# Patient Record
Sex: Female | Born: 2000 | Hispanic: Yes | Marital: Single | Smoking: Never smoker
Health system: Southern US, Community
[De-identification: ages and names within clinical notes are randomized; demographics above are authoritative.]

## PROBLEM LIST (undated history)

## (undated) ENCOUNTER — Ambulatory Visit (HOSPITAL_COMMUNITY): Payer: Self-pay

## (undated) DIAGNOSIS — G43909 Migraine, unspecified, not intractable, without status migrainosus: Secondary | ICD-10-CM

## (undated) DIAGNOSIS — J302 Other seasonal allergic rhinitis: Secondary | ICD-10-CM

## (undated) DIAGNOSIS — J45909 Unspecified asthma, uncomplicated: Secondary | ICD-10-CM

## (undated) HISTORY — DX: Unspecified asthma, uncomplicated: J45.909

## (undated) HISTORY — DX: Other seasonal allergic rhinitis: J30.2

---
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
## 2018-01-11 ENCOUNTER — Ambulatory Visit: Payer: Self-pay

## 2018-01-18 ENCOUNTER — Ambulatory Visit: Payer: Self-pay

## 2018-02-10 ENCOUNTER — Ambulatory Visit (INDEPENDENT_AMBULATORY_CARE_PROVIDER_SITE_OTHER): Payer: Self-pay | Admitting: Pediatrics

## 2018-02-10 ENCOUNTER — Ambulatory Visit (INDEPENDENT_AMBULATORY_CARE_PROVIDER_SITE_OTHER): Payer: Self-pay | Admitting: Licensed Clinical Social Worker

## 2018-02-10 ENCOUNTER — Other Ambulatory Visit: Payer: Self-pay

## 2018-02-10 ENCOUNTER — Encounter: Payer: Self-pay | Admitting: Pediatrics

## 2018-02-10 VITALS — BP 98/66 | HR 64 | Ht 59.5 in | Wt 165.6 lb

## 2018-02-10 DIAGNOSIS — E559 Vitamin D deficiency, unspecified: Secondary | ICD-10-CM

## 2018-02-10 DIAGNOSIS — Z1331 Encounter for screening for depression: Secondary | ICD-10-CM

## 2018-02-10 DIAGNOSIS — R94120 Abnormal auditory function study: Secondary | ICD-10-CM

## 2018-02-10 DIAGNOSIS — K029 Dental caries, unspecified: Secondary | ICD-10-CM

## 2018-02-10 DIAGNOSIS — Z0001 Encounter for general adult medical examination with abnormal findings: Secondary | ICD-10-CM

## 2018-02-10 DIAGNOSIS — Z113 Encounter for screening for infections with a predominantly sexual mode of transmission: Secondary | ICD-10-CM

## 2018-02-10 DIAGNOSIS — E669 Obesity, unspecified: Secondary | ICD-10-CM

## 2018-02-10 DIAGNOSIS — Z975 Presence of (intrauterine) contraceptive device: Secondary | ICD-10-CM

## 2018-02-10 DIAGNOSIS — Z68.41 Body mass index (BMI) pediatric, greater than or equal to 95th percentile for age: Secondary | ICD-10-CM

## 2018-02-10 LAB — POCT RAPID HIV: RAPID HIV, POC: NEGATIVE

## 2018-02-10 NOTE — BH Specialist Note (Signed)
Integrated Behavioral Health Initial Visit  MRN: [id number] [facility]  Number of Integrated Behavioral Health Clinician visits:: 1/6 Session Start time: 2:10  Session End time: 2:19 Total time: 9 mins  Type of Service: Integrated Behavioral Health- Individual/Family Interpretor:No. Interpretor Name and Language: n/a   Warm Hand Off Completed.       SUBJECTIVE: Cathy Burns is a 17 y.o. female accompanied by self Patient was referred by Dr. Luna FuseEttefagh for PHQ Review. Patient reports the following symptoms/concerns: Pt reports sometimes feeling tired, would like to get more sleep; currently gets about 4-5 hours a night, would like to increase to 8 hrs. 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 Reduce symptoms of: insomnia 2. Increase knowledge and/or ability of: healthy habits  3. Demonstrate ability to: Increase motivation to adhere to plan of care  INTERVENTIONS: Interventions utilized: Motivational Interviewing, Solution-Focused Strategies, Supportive Counseling, Sleep Hygiene and Psychoeducation and/or Health Education  Standardized Assessments completed: PHQ 9 Modified for Teens; score of 3, results in flowsheets Counseled regarding 5-2-1-0 goals of healthy active living including:  - eating at least 5 fruits and vegetables a day - at least 1 hour of activity - no sugary beverages - eating three meals each day with age-appropriate servings - age-appropriate screen time - age-appropriate sleep patterns     ASSESSMENT: Patient currently  experiencing an interest in making changes to sleep habits and phone use. Pt is experiencing feeling tired during the day due to increased phone use in the evenings.   Patient may benefit from increasing hours of sleep from 4-5 to 8 hrs a night. Pt may also benefit from charging her phone at a different location away from her bed to reduce phone use in the evening.  PLAN: 1. Follow up with behavioral health clinician on : none scheduled, [facility] open to visits in the future as needed 2. Behavioral recommendations: Pt will plug her phone in for the evening away from the bed 3. Referral(s): None at this time 4. "From scale of 1-10, how likely are you to follow plan?": Pt voiced understanding and agreement  Cathy Burns, LPCA

## 2018-02-10 NOTE — Progress Notes (Signed)
Adolescent Well Care Visit Cathy Burns is a 17 y.o. female who is here for well care.    PCP:  Karlene Einstein, MD    History was provided by the patient.  Confidentiality was discussed with the patient and, if applicable, with caregiver as well. Patient's personal or confidential phone number: in chart (patient is 17 years old)  Current Issues: Current concerns include none.   Nutrition: Nutrition/Eating Behaviors: not many fruits or vegetables (eating healthier since started working at a [employer]),  Adequate calcium in diet?: yes Supplements/ Vitamins: none  Exercise/ Media: Play any Sports?/ Exercise: none Screen Time:  > 2 hours-counseling provided Media Rules or Monitoring?: no  Sleep:  Sleep: 4-5 hours per night, staying up late on her phone  Social Screening: Lives with:  Mother and siblings Parental relations:  good Activities, Work, and Research officer, political party?: works at a [employer] of note: no  Education: School Name: [employer] - first year School Grade: [place] performance: doing well; no concerns except biology is hard  Menstruation:   Patient's last menstrual period was 01/09/2018 (exact date). Menstrual History: one female sexual partner.  Mexplanon is place due to be replaced in December 2019.  Confidential Social History: Tobacco?  no Secondhand smoke exposure?  no Drugs/ETOH?  no  Sexually Active?  yes  - 1 female partner (boyfriend), vaginal intercourse Pregnancy Prevention: nexplanon, doesn't always use condoms  Safe at home, in school & in relationships?  Yes Safe to self?  Yes   Screenings: Patient has a dental home: no - uninsured  The patient completed the Rapid Assessment for Adolescent Preventive Services screening questionnaire and the following topics were identified as risk factors and discussed: exercise and condom use  In addition, the following topics were discussed as part of anticipatory  guidance healthy eating, exercise and birth control.  PHQ-9 completed and results indicated no signs of depression but some sleep concerns.  Patient met with integrated [facility] today and made plan for help with sleep.  See separate [facility] note.    Physical Exam:  Vitals:   02/10/18 1350  BP: 98/66  Pulse: 64  SpO2: 100%  Weight: 165 lb 9.6 oz (75.1 kg)  Height: 4' 11.5" (1.511 m)   BP 98/66 (BP Location: Right Arm, Patient Position: Sitting, Cuff Size: Normal)   Pulse 64   Ht 4' 11.5" (1.511 m)   Wt 165 lb 9.6 oz (75.1 kg)   LMP 01/09/2018 (Exact Date)   SpO2 100%   BMI 32.89 kg/m  Body mass index: body mass index is 32.89 kg/m. Blood pressure percentiles are 12 % systolic and 54 % diastolic based on the August 2017 AAP Clinical Practice Guideline. Blood pressure percentile targets: 90: 123/76, 95: 127/80, 95 + 12 mmHg: 139/92.   Hearing Screening   Method: Audiometry   _0  _1  _2  _3  _4  _5  _6  _7  _8   Right ear:   40 40 20  20    Left ear:   _9 Visual Acuity Screening   Right eye Left eye Both eyes  Without correction:     With correction: _10    General Appearance:   alert, oriented, no acute distress and well nourished  HENT: Normocephalic, no obvious abnormality, conjunctiva clear  Mouth:   Normal appearing teeth, no obvious discoloration, dental caries, or dental caps  Neck:   Supple; thyroid: no enlargement, symmetric, no tenderness/mass/nodules  Chest Tanner IV female,  no masses  Lungs:   Clear to auscultation bilaterally, normal work of breathing  Heart:   Regular rate and rhythm, S1 and S2 normal, no murmurs;   Abdomen:   Soft, non-tender, no mass, or organomegaly  GU normal female external genitalia, pelvic not performed, Tanner stage IV (shaved pubic hair), cafe au lait present adjancent to left labia majora  Musculoskeletal:   Tone and strength strong and symmetrical, all extremities               Lymphatic:    No cervical adenopathy  Skin/Hair/Nails:   Skin warm, dry and intact, no rashes, no bruises or petechiae  Neurologic:   Strength, gait, and coordination normal and age-appropriate     Assessment and Plan:   1. Routine screening for STI (sexually transmitted infection) Recommend condom use with every encounter. - POCT Rapid HIV - C. trachomatis/N. gonorrhoeae RNA  2. Nexplanon in place Reviewed that nexplanon replacement is due in December 2019.  Patient to call for appointment.    3. Dental caries Visible on exam and discussed with patient.  Recommend that [place] call the [place] clinic after she has been approved for the [facility] program.    4. Vitamin D deficiency Due for repeat vitamin D level..  Will obtain with fasting labs.  5. Abnormal hearing screen Likely due to eustachian tube dysfunction and retracted TM in the setting of recent URI symptoms.  Will rescreen in 1 year or sooner if concerns arise.     BMI is not appropriate for age - Counseled regarding 5-2-1-0 goals of healthy active living including:  - eating at least 5 fruits and vegetables a day - at least 1 hour of activity - no sugary beverages - eating three meals each day with age-appropriate servings - age-appropriate screen time - age-appropriate sleep patterns   Healthy-active living behaviors, family history, ROS and physical exam were reviewed for risk factors for overweight/obesity and related health conditions.  This patient is at increased risk of obesity-related comborbities.  Labs today: No - will schedule nurse visit for fasting labs once financial assistance is approved Nutrition referral: No  Follow-up recommended: No    Hearing screening result:abnormal-  Failed in right ear and right TM retracted, likely due to recent URI symptoms. Vision screening result: normal   Return for 17 year old PE with Dr. Doneen Poisson in 1 year.Lamarr Lulas, MD

## 2018-02-10 NOTE — Patient Instructions (Signed)
[facility]  [address] [place], [place] Phone [phone]

## 2018-02-11 LAB — C. TRACHOMATIS/N. GONORRHOEAE RNA
C. TRACHOMATIS RNA, TMA: NOT DETECTED
N. GONORRHOEAE RNA, TMA: NOT DETECTED

## 2018-10-06 ENCOUNTER — Other Ambulatory Visit: Payer: Self-pay

## 2018-10-06 ENCOUNTER — Encounter: Payer: Self-pay | Admitting: Pediatrics

## 2018-10-06 ENCOUNTER — Ambulatory Visit (INDEPENDENT_AMBULATORY_CARE_PROVIDER_SITE_OTHER): Payer: Self-pay | Admitting: Pediatrics

## 2018-10-06 VITALS — Temp 98.7°F | Wt 167.2 lb

## 2018-10-06 DIAGNOSIS — R109 Unspecified abdominal pain: Secondary | ICD-10-CM

## 2018-10-06 DIAGNOSIS — Z1389 Encounter for screening for other disorder: Secondary | ICD-10-CM

## 2018-10-06 LAB — POCT URINALYSIS DIPSTICK
Bilirubin, UA: NEGATIVE
GLUCOSE UA: NEGATIVE
Ketones, UA: NEGATIVE
LEUKOCYTES UA: NEGATIVE
NITRITE UA: NEGATIVE
PROTEIN UA: NEGATIVE
Spec Grav, UA: 1.01 (ref 1.010–1.025)
Urobilinogen, UA: 0.2 E.U./dL
pH, UA: 5 (ref 5.0–8.0)

## 2018-10-06 NOTE — Patient Instructions (Signed)

## 2018-10-06 NOTE — Progress Notes (Signed)
   Subjective:     Cathy Burns, is a 17 y.o. female   History provider by patient and mother No interpreter necessary.  Chief Complaint  Patient presents with  . Abdominal Pain    referred to [facility]/pharmacy for flushot and Td. c/o pains 3 days, feels "hot flashes", denies V/D/dysuria/Constipation.     HPI: Cathy Burns began having abdominal pain on Monday She describes the pain as sharp, non-radiating on her L flank that is worse with walking or laying down. The pain is better with sitting and resting. This morning pain worsened to an 8/10 pain. It is associated with hot flashes and nausea. No emesis, diarrhea, pain with urination, CP, SOB. She is afebrile, eatining, and drinking okay. She states that the pain is better at the present time.    No sick contacts, recent travel.  LMP 2 weeks ago and has been regular.   Review of Systems  Constitutional: Negative for activity change, diaphoresis, fatigue and fever.  Respiratory: Negative for cough.   Cardiovascular: Negative for chest pain, palpitations and leg swelling.  Gastrointestinal: Positive for abdominal pain. Negative for abdominal distention.      Patient's history was reviewed and updated as appropriate: allergies, current medications, past family history, past medical history, past social history, past surgical history and problem list.     Objective:     Temp 98.7 F (37.1 C) (Oral)   Wt 167 lb 3.2 oz (75.8 kg)   BMI 33.21 kg/m     Physical Exam  Constitutional: She appears well-developed and well-nourished. She is active. No distress.  Cardiovascular: Regular rhythm, S1 normal and S2 normal. Pulses are strong.  Pulmonary/Chest: Effort normal and breath sounds normal. She has no wheezes.  Abdominal: Soft. Bowel sounds are normal. She exhibits no mass. There is no hepatosplenomegaly. No signs of injury. There is mid abdominal tenderness also tenderness (L Flank). No CVA tenderness .  No guarding.     Neurological: She is alert.  Skin: Skin is warm. Capillary refill takes less than 2 seconds.     Assessment & Plan:  Cathy Burns a 17 yo female with a 5 day history of sharp L flank pain. UA in clinic today displayed trace blood. The etiology of her pain is unclear- LMP was two weeks ago, and patient is on birth control, pregnancy is unlikely but not impossible.  Will call patient to suggest pregnancy test since one was not done in clinic.  urine blood also may be indicative of a kidney stone. She might also have an ovarian cyst but pain is superior to where ovaries lie.  Overall, she is well-appearing, afebrile, with milder pain symptoms on exam. We encouraged hydration, OTC pain control, and outlined return precautions. Mom and patient understood and agreed with the plan.   No follow-ups on file.  Marrion Coy, MD   ================================= Attending Attestation  I saw and evaluated the patient, performing the key elements of the service. I developed the management plan that is described in the resident's note, and I agree with the content, with any edits included as necessary.   Darrall Dears                  10/06/2018, 10:37 PM

## 2018-10-07 ENCOUNTER — Telehealth: Payer: Self-pay | Admitting: Student in an Organized Health Care Education/Training Program

## 2018-10-07 ENCOUNTER — Ambulatory Visit: Payer: Self-pay

## 2018-10-07 NOTE — Telephone Encounter (Signed)
-----   Message from Darrall Dears, MD sent at 10/06/2018 10:42 PM EST ----- Regarding: urine pregnancy test? Angelique Blonder,  Can you call this patient and ask if she would be interested in coming to clinic to get a urine HCG test done to rule out pregnancy given the symptoms she presented with yesterday?  Margene Cherian, it might be overreach since she has Nexplanon but given her age and sexual activity, it's probably a better idea to assume that its possible to get pregnant on any form of birth control.    Thanks!

## 2018-10-11 NOTE — Telephone Encounter (Addendum)
I called and left a VM asking patient to call our office to follow-up on her recent visit and let us know how she is doing.  If she calls back, please schedule her for a follow-up appointment for additional testing.

## 2018-10-12 NOTE — Telephone Encounter (Signed)
I called the number listed. Left a message to to give us a call when she can to schedule an appointment if needed.

## 2018-12-02 ENCOUNTER — Ambulatory Visit (INDEPENDENT_AMBULATORY_CARE_PROVIDER_SITE_OTHER): Payer: Self-pay | Admitting: Pediatrics

## 2018-12-02 ENCOUNTER — Encounter: Payer: Self-pay | Admitting: Pediatrics

## 2018-12-02 VITALS — Temp 97.6°F | Wt 170.8 lb

## 2018-12-02 DIAGNOSIS — Z113 Encounter for screening for infections with a predominantly sexual mode of transmission: Secondary | ICD-10-CM

## 2018-12-02 DIAGNOSIS — Z3202 Encounter for pregnancy test, result negative: Secondary | ICD-10-CM

## 2018-12-02 DIAGNOSIS — Z308 Encounter for other contraceptive management: Secondary | ICD-10-CM

## 2018-12-02 LAB — POCT URINE PREGNANCY: PREG TEST UR: NEGATIVE

## 2018-12-02 MED ORDER — ETONOGESTREL 68 MG ~~LOC~~ IMPL
68.0000 mg | DRUG_IMPLANT | Freq: Once | SUBCUTANEOUS | Status: AC
  Administered 2018-12-02: 68 mg via SUBCUTANEOUS

## 2018-12-02 NOTE — Progress Notes (Signed)
Here for Nexplanon removal and reinsertion.  Placed originally December 2016.  Occasionally has spotting, periods irregular, but otherwise no problems   Only one partner in past year.  Last GC/CT screening March 2019 and negative.   Risks & benefits of Nexplanon removal discussed. Consent form signed.  The patient denies any allergies to anesthetics or antiseptics.  Procedure: Pt was placed in supine position. left arm was flexed at the elbow and externally rotated so that her wrist was parallel to her ear, The device was palpated and marked. The site was cleaned with Betadine. The area surrounding the device was covered with a sterile drape. 1% lidocaine was injected just under the device. A scalpel was used to create a small incision. The device was pushed towards the incision. Fibrous tissue surrounding the device was gradually removed from the device. The device was removed and measured to ensure all 4 cm of device was removed.  Nexplanon Insertion  No contraindications for placement.  No liver disease, no unexplained vaginal bleeding, no h/o breast cancer, no h/o blood clots.  No LMP recorded.  [facility]: negative  Risks & benefits of Nexplanon discussed The nexplanon device was purchased and supplied by [facility]. Packaging instructions supplied to patient Consent form signed  The patient denies any allergies to anesthetics or antiseptics.  Procedure: Pt was placed in supine position. The left arm was flexed at the elbow and externally rotated so that her wrist was parallel to her ear The medial epicondyle of the left arm was identified The insertions site was marked 8 cm proximal to the medial epicondyle The insertion site was cleaned with Betadine The area surrounding the insertion site was covered with a sterile drape 1% lidocaine was injected just under the skin at the insertion site extending 4 cm proximally. The sterile preloaded disposable Nexaplanon applicator was  removed from the sterile packaging The applicator needle was inserted at a 30 degree angle at 8 cm proximal to the medial epicondyle as marked The applicator was lowered to a horizontal position and advanced just under the skin for the full length of the needle The slider on the applicator was retracted fully while the applicator remained in the same position, then the applicator was removed. The implant was confirmed via palpation as being in position The implant position was demonstrated to the patient Pressure dressing was applied to the patient.  The patient was instructed to removed the pressure dressing in 24 hrs.  The patient was advised to move slowly from a supine to an upright position  The patient denied any concerns or complaints  The patient acknowledged agreement and understanding of the plan.  Dory Peru, MD

## 2018-12-03 LAB — C. TRACHOMATIS/N. GONORRHOEAE RNA
C. trachomatis RNA, TMA: NOT DETECTED
N. GONORRHOEAE RNA, TMA: NOT DETECTED

## 2020-02-22 ENCOUNTER — Ambulatory Visit: Payer: Self-pay | Attending: Internal Medicine

## 2020-02-22 DIAGNOSIS — Z23 Encounter for immunization: Secondary | ICD-10-CM

## 2020-02-22 NOTE — Progress Notes (Signed)
   Covid-19 Vaccination Clinic  Name:  Alisabeth Selkirk    MRN: [id number] DOB: 2001-10-29  02/22/2020  Ms. Septiem-Garcia was observed post Covid-19 immunization for 15 minutes without incident. She was provided with Vaccine Information Sheet and instruction to access the V-Safe system.   Ms. Markman was instructed to call 911 with any severe reactions post vaccine: Marland Kitchen Difficulty breathing  . Swelling of face and throat  . A fast heartbeat  . A bad rash all over body  . Dizziness and weakness   Immunizations Administered    Name Date Dose VIS Date Route   Pfizer COVID-19 Vaccine 02/22/2020 12:24 PM 0.3 mL 11/10/2019 Intramuscular   Manufacturer: [employer], [employer]   Lot: [id number]   NDC: [address]-5

## 2020-03-19 ENCOUNTER — Ambulatory Visit: Payer: Self-pay | Attending: Internal Medicine

## 2020-03-19 DIAGNOSIS — Z23 Encounter for immunization: Secondary | ICD-10-CM

## 2020-03-19 NOTE — Progress Notes (Signed)
   Covid-19 Vaccination Clinic  Name:  Cathy Burns    MRN: [id number] DOB: 2001/01/01  03/19/2020  Ms. Cathy Burns was observed post Covid-19 immunization for 15 minutes without incident. She was provided with Vaccine Information Sheet and instruction to access the V-Safe system.   Ms. Cathy Burns was instructed to call 911 with any severe reactions post vaccine: Marland Kitchen Difficulty breathing  . Swelling of face and throat  . A fast heartbeat  . A bad rash all over body  . Dizziness and weakness   Immunizations Administered    Name Date Dose VIS Date Route   Pfizer COVID-19 Vaccine 03/19/2020  8:45 AM 0.3 mL 01/24/2019 Intramuscular   Manufacturer: [employer], [employer]   Lot: [id number]   NDC: [address]-0

## 2021-07-31 ENCOUNTER — Telehealth: Payer: Self-pay

## 2021-07-31 DIAGNOSIS — Z09 Encounter for follow-up examination after completed treatment for conditions other than malignant neoplasm: Secondary | ICD-10-CM

## 2021-07-31 NOTE — Telephone Encounter (Signed)
SWCM mailed adolescent transition dismissal letter to pt, marked dismissed  from practice in pt's chart due to age. Can be seen by [facility] until age 20 if needed.   Brighten Buzzelli, BSW, QP Case Manager [facility] Office: [phone] Direct Number: [phone]  

## 2023-08-27 ENCOUNTER — Ambulatory Visit (HOSPITAL_BASED_OUTPATIENT_CLINIC_OR_DEPARTMENT_OTHER): Payer: Self-pay | Admitting: Family Medicine
